# Patient Record
Sex: Female | Born: 1960 | Race: White | Hispanic: No | Marital: Married | Smoking: Former smoker
Health system: Southern US, Community
[De-identification: ages and names within clinical notes are randomized; demographics above are authoritative.]

## PROBLEM LIST (undated history)

## (undated) DIAGNOSIS — E785 Hyperlipidemia, unspecified: Secondary | ICD-10-CM

## (undated) DIAGNOSIS — I10 Essential (primary) hypertension: Secondary | ICD-10-CM

## (undated) DIAGNOSIS — J45909 Unspecified asthma, uncomplicated: Secondary | ICD-10-CM

## (undated) DIAGNOSIS — K5900 Constipation, unspecified: Secondary | ICD-10-CM

## (undated) DIAGNOSIS — T7840XA Allergy, unspecified, initial encounter: Secondary | ICD-10-CM

## (undated) HISTORY — DX: Unspecified asthma, uncomplicated: J45.909

## (undated) HISTORY — PX: ENDOMETRIAL ABLATION: SHX621

## (undated) HISTORY — DX: Hyperlipidemia, unspecified: E78.5

## (undated) HISTORY — DX: Constipation, unspecified: K59.00

## (undated) HISTORY — DX: Essential (primary) hypertension: I10

## (undated) HISTORY — PX: RETINAL TEAR REPAIR CRYOTHERAPY: SHX5304

## (undated) HISTORY — DX: Allergy, unspecified, initial encounter: T78.40XA

---

## 2014-07-11 ENCOUNTER — Encounter: Payer: Self-pay | Admitting: Gastroenterology

## 2014-08-07 ENCOUNTER — Ambulatory Visit (AMBULATORY_SURGERY_CENTER): Payer: Self-pay | Admitting: *Deleted

## 2014-08-07 VITALS — Ht 64.0 in | Wt 167.0 lb

## 2014-08-07 DIAGNOSIS — Z1211 Encounter for screening for malignant neoplasm of colon: Secondary | ICD-10-CM

## 2014-08-07 MED ORDER — MOVIPREP 100 G PO SOLR: 1.0000 | Freq: Once | ORAL | Status: DC

## 2014-08-07 NOTE — Progress Notes (Signed)
No egg or soy allergy No issues with past sedation. With the endometrial ablation had some itching post op, not severe and ? What sedation used  No home 02 No diet pills No emmi

## 2014-08-21 ENCOUNTER — Telehealth: Payer: Self-pay | Admitting: Gastroenterology

## 2014-08-21 NOTE — Telephone Encounter (Signed)
7471 am [facility] at number pt left at 0944 am. Lelan Pons PV

## 2014-08-21 NOTE — Telephone Encounter (Signed)
  Pt calling states she has occasional issue with her blood sugar dropping if she doesn't eat and wanted to know what she could drink to keep her sugar raised.  Instructed pt she can use gatorade( contains a lot of sugar), regular juices and sodas, popsicles and jello. Instructed to drink a lot of these things to maintain her sugar.  Told her the more she drinks the better and that she can try the clear peach ensure for protein of she chooses to do so. Pt verbalized understanding of instructions given Marijean Niemann

## 2014-08-21 NOTE — Telephone Encounter (Signed)
1021 am. Pt had RC when I had pt in office for PV. Asked receptionist to tell her i would return her call after finished, called with no answer again, [facility]. Lelan Pons PV

## 2014-08-23 ENCOUNTER — Encounter: Payer: Self-pay | Admitting: Gastroenterology

## 2014-08-23 ENCOUNTER — Ambulatory Visit (AMBULATORY_SURGERY_CENTER): Payer: BLUE CROSS/BLUE SHIELD | Admitting: Gastroenterology

## 2014-08-23 VITALS — BP 114/76 | HR 58 | Temp 97.2°F | Resp 13 | Ht 64.0 in | Wt 167.0 lb

## 2014-08-23 DIAGNOSIS — D124 Benign neoplasm of descending colon: Secondary | ICD-10-CM

## 2014-08-23 DIAGNOSIS — D123 Benign neoplasm of transverse colon: Secondary | ICD-10-CM

## 2014-08-23 DIAGNOSIS — Z1211 Encounter for screening for malignant neoplasm of colon: Secondary | ICD-10-CM

## 2014-08-23 MED ORDER — SODIUM CHLORIDE 0.9 % IV SOLN: 500.0000 mL | INTRAVENOUS | Status: DC

## 2014-08-23 NOTE — Progress Notes (Signed)
To recovery, report to Willis, RN, VSS 

## 2014-08-23 NOTE — Progress Notes (Signed)
Called to room to assist during endoscopic procedure.  Patient ID and intended procedure confirmed with present staff. Received instructions for my participation in the procedure from the performing physician.  

## 2014-08-23 NOTE — Progress Notes (Signed)
No problems noted in the recovery room. maw 

## 2014-08-23 NOTE — Patient Instructions (Signed)
YOU HAD AN ENDOSCOPIC PROCEDURE TODAY AT THE [facility]:   Refer to the procedure report that was given to you for any specific questions about what was found during the examination.  If the procedure report does not answer your questions, please call your gastroenterologist to clarify.  If you requested that your care partner not be given the details of your procedure findings, then the procedure report has been included in a sealed envelope for you to review at your convenience later.  YOU SHOULD EXPECT: Some feelings of bloating in the abdomen. Passage of more gas than usual.  Walking can help get rid of the air that was put into your GI tract during the procedure and reduce the bloating. If you had a lower endoscopy (such as a colonoscopy or flexible sigmoidoscopy) you may notice spotting of blood in your stool or on the toilet paper. If you underwent a bowel prep for your procedure, you may not have a normal bowel movement for a few days.  Please Note:  You might notice some irritation and congestion in your nose or some drainage.  This is from the oxygen used during your procedure.  There is no need for concern and it should clear up in a day or so.  SYMPTOMS TO REPORT IMMEDIATELY:   Following lower endoscopy (colonoscopy or flexible sigmoidoscopy):  Excessive amounts of blood in the stool  Significant tenderness or worsening of abdominal pains  Swelling of the abdomen that is new, acute  Fever of 100F or higher   For urgent or emergent issues, a gastroenterologist can be reached at any hour by calling [phone].   DIET: Your first meal following the procedure should be a small meal and then it is ok to progress to your normal diet. Heavy or fried foods are harder to digest and may make you feel nauseous or bloated.  Likewise, meals heavy in dairy and vegetables can increase bloating.  Drink plenty of fluids but you should avoid alcoholic beverages for 24  hours.  ACTIVITY:  You should plan to take it easy for the rest of today and you should NOT DRIVE or use heavy machinery until tomorrow (because of the sedation medicines used during the test).    FOLLOW UP: Our staff will call the number listed on your records the next business day following your procedure to check on you and address any questions or concerns that you may have regarding the information given to you following your procedure. If we do not reach you, we will leave a message.  However, if you are feeling well and you are not experiencing any problems, there is no need to return our call.  We will assume that you have returned to your regular daily activities without incident.  If any biopsies were taken you will be contacted by phone or by letter within the next 1-3 weeks.  Please call us at [phone] if you have not heard about the biopsies in 3 weeks.    SIGNATURES/CONFIDENTIALITY: You and/or your care partner have signed paperwork which will be entered into your electronic medical record.  These signatures attest to the fact that that the information above on your After Visit Summary has been reviewed and is understood.  Full responsibility of the confidentiality of this discharge information lies with you and/or your care-partner.     Handout was given to your care partner on polyps. You may resume your current medications today. Await biopsy results. Please call   if any questions or concerns.   

## 2014-08-23 NOTE — Op Note (Addendum)
[facility]  [address] [place], [address]   COLONOSCOPY PROCEDURE REPORT  PATIENT: Latasha Morris, Latasha Morris  MR#: [id number] BIRTHDATE: [birth year] , 54  yrs. old GENDER: female ENDOSCOPIST: Milus Banister, MD REFERRED OP:[id number] Asher Muir, M.D. PROCEDURE DATE:  08/23/2014 PROCEDURE:   Colonoscopy, screening and Colonoscopy with snare polypectomy First Screening Colonoscopy - Avg.  risk and is 50 yrs.  old or older Yes.  Prior Negative Screening - Now for repeat screening. N/A  History of Adenoma - Now for follow-up colonoscopy & has been > or = to 3 yrs.  N/A  Polyps removed today? Yes ASA CLASS:   Class II INDICATIONS:Screening for colonic neoplasia and Colorectal Neoplasm Risk Assessment for this procedure is average risk. MEDICATIONS: Monitored anesthesia care and Propofol 200 mg IV  DESCRIPTION OF PROCEDURE:   After the risks benefits and alternatives of the procedure were thoroughly explained, informed consent was obtained.  The digital rectal exam revealed no abnormalities of the rectum.   The LB [id number]  endoscope was introduced through the anus and advanced to the cecum, which was identified by both the appendix and ileocecal valve. No adverse events experienced.   The quality of the prep was excellent.  The instrument was then slowly withdrawn as the colon was fully examined. Estimated blood loss is zero unless otherwise noted in this procedure report.   COLON FINDINGS: Two sessile polyps ranging between 3-29mm in size were found in the descending colon and transverse colon. Polypectomies were performed with a cold snare.  The resection was complete, the polyp tissue was completely retrieved and sent to histology.   The examination was otherwise normal.  Retroflexed views revealed no abnormalities. The time to cecum = 1.9 Withdrawal time = 11.0   The scope was withdrawn and the procedure completed. COMPLICATIONS: There were no immediate  complications.  ENDOSCOPIC IMPRESSION: 1.  Two sessile polyps ranging between 3-40mm in size were found in the descending colon and transverse colon; polypectomies were performed with a cold snare 2.   The examination was otherwise normal  RECOMMENDATIONS: If the polyp(s) removed today are proven to be adenomatous (pre-cancerous) polyps, you will need a repeat colonoscopy in 5 years.  Otherwise you should continue to follow colorectal cancer screening guidelines for "routine risk" patients with colonoscopy in 10 years.  You will receive a letter within 1-2 weeks with the results of your biopsy as well as final recommendations.  Please call my office if you have not received a letter after 3 weeks.  eSigned:  Milus Banister, MD 08/23/2014 8:50 AM

## 2014-08-26 ENCOUNTER — Telehealth: Payer: Self-pay | Admitting: *Deleted

## 2014-08-26 NOTE — Telephone Encounter (Signed)
  Follow up Call-  Call back number 08/23/2014  Post procedure Call Back phone  # [phone]  Permission to leave phone message Yes     Patient questions:  Do you have a fever, pain , or abdominal swelling? No. Pain Score  0 *  Have you tolerated food without any problems? Yes.    Have you been able to return to your normal activities? Yes.    Do you have any questions about your discharge instructions: Diet   No. Medications  No. Follow up visit  No.  Do you have questions or concerns about your Care? No.  Actions: * If pain score is 4 or above: No action needed, pain <4.

## 2014-08-27 ENCOUNTER — Encounter: Payer: Self-pay | Admitting: Gastroenterology

## 2018-06-12 ENCOUNTER — Other Ambulatory Visit: Payer: Self-pay | Admitting: Surgical Oncology

## 2018-06-12 DIAGNOSIS — R9389 Abnormal findings on diagnostic imaging of other specified body structures: Secondary | ICD-10-CM

## 2018-06-12 DIAGNOSIS — N6452 Nipple discharge: Secondary | ICD-10-CM

## 2018-06-20 ENCOUNTER — Other Ambulatory Visit: Payer: Self-pay | Admitting: Surgical Oncology

## 2018-06-20 DIAGNOSIS — R9389 Abnormal findings on diagnostic imaging of other specified body structures: Secondary | ICD-10-CM

## 2018-06-21 ENCOUNTER — Ambulatory Visit
Admission: RE | Admit: 2018-06-21 | Discharge: 2018-06-21 | Disposition: A | Discharge status: Home / Self Care | Payer: BLUE CROSS/BLUE SHIELD | Source: Ambulatory Visit | Attending: Surgical Oncology | Admitting: Surgical Oncology

## 2018-06-21 ENCOUNTER — Other Ambulatory Visit: Payer: Self-pay

## 2018-06-21 DIAGNOSIS — R9389 Abnormal findings on diagnostic imaging of other specified body structures: Secondary | ICD-10-CM

## 2018-06-21 DIAGNOSIS — N6452 Nipple discharge: Secondary | ICD-10-CM

## 2018-06-21 MED ORDER — GADOBUTROL 1 MMOL/ML IV SOLN
8.0000 mL | Freq: Once | INTRAVENOUS | Status: AC | PRN
  Administered 2018-06-21: 8 mL via INTRAVENOUS

## 2019-03-23 DIAGNOSIS — M549 Dorsalgia, unspecified: Secondary | ICD-10-CM

## 2019-03-23 HISTORY — DX: Dorsalgia, unspecified: M54.9

## 2019-10-25 ENCOUNTER — Encounter: Payer: Self-pay | Admitting: Gastroenterology

## 2020-01-04 ENCOUNTER — Other Ambulatory Visit: Payer: Self-pay

## 2020-01-04 ENCOUNTER — Ambulatory Visit (AMBULATORY_SURGERY_CENTER): Payer: Self-pay | Admitting: *Deleted

## 2020-01-04 VITALS — Ht 64.0 in | Wt 178.0 lb

## 2020-01-04 DIAGNOSIS — Z86010 Personal history of colonic polyps: Secondary | ICD-10-CM

## 2020-01-04 NOTE — Progress Notes (Signed)

## 2020-01-07 ENCOUNTER — Encounter: Payer: Self-pay | Admitting: Gastroenterology

## 2020-01-18 ENCOUNTER — Encounter: Payer: Self-pay | Admitting: Gastroenterology

## 2020-01-18 ENCOUNTER — Ambulatory Visit (AMBULATORY_SURGERY_CENTER): Payer: BC Managed Care – PPO | Admitting: Gastroenterology

## 2020-01-18 ENCOUNTER — Other Ambulatory Visit: Payer: Self-pay

## 2020-01-18 VITALS — BP 137/83 | HR 63 | Temp 98.0°F | Resp 12 | Ht 64.0 in | Wt 178.0 lb

## 2020-01-18 DIAGNOSIS — Z86010 Personal history of colonic polyps: Secondary | ICD-10-CM

## 2020-01-18 HISTORY — PX: COLONOSCOPY: SHX174

## 2020-01-18 MED ORDER — SODIUM CHLORIDE 0.9 % IV SOLN: 500.0000 mL | Freq: Once | INTRAVENOUS | Status: AC

## 2020-01-18 NOTE — Progress Notes (Addendum)
Vital signs checked by:CW  The patient states no changes in medical or surgical history since pre-visit screening on 01/04/20.  Patient has elevated BP, states she took her BP mededication this morning but vomited shortly after.  CRNA and MD made aware.

## 2020-01-18 NOTE — Patient Instructions (Signed)
Thank you for letting us take care of your healthcare needs today.   YOU HAD AN ENDOSCOPIC PROCEDURE TODAY AT THE  ENDOSCOPY CENTER:   Refer to the procedure report that was given to you for any specific questions about what was found during the examination.  If the procedure report does not answer your questions, please call your gastroenterologist to clarify.  If you requested that your care partner not be given the details of your procedure findings, then the procedure report has been included in a sealed envelope for you to review at your convenience later.  YOU SHOULD EXPECT: Some feelings of bloating in the abdomen. Passage of more gas than usual.  Walking can help get rid of the air that was put into your GI tract during the procedure and reduce the bloating. If you had a lower endoscopy (such as a colonoscopy or flexible sigmoidoscopy) you may notice spotting of blood in your stool or on the toilet paper. If you underwent a bowel prep for your procedure, you may not have a normal bowel movement for a few days.  Please Note:  You might notice some irritation and congestion in your nose or some drainage.  This is from the oxygen used during your procedure.  There is no need for concern and it should clear up in a day or so.  SYMPTOMS TO REPORT IMMEDIATELY:  Following lower endoscopy (colonoscopy or flexible sigmoidoscopy):  Excessive amounts of blood in the stool  Significant tenderness or worsening of abdominal pains  Swelling of the abdomen that is new, acute  Fever of 100F or higher  For urgent or emergent issues, a gastroenterologist can be reached at any hour by calling [phone]. Do not use MyChart messaging for urgent concerns.    DIET:  We do recommend a small meal at first, but then you may proceed to your regular diet.  Drink plenty of fluids but you should avoid alcoholic beverages for 24 hours.  ACTIVITY:  You should plan to take it easy for the rest of today and  you should NOT DRIVE or use heavy machinery until tomorrow (because of the sedation medicines used during the test).    FOLLOW UP: Our staff will call the number listed on your records 48-72 hours following your procedure to check on you and address any questions or concerns that you may have regarding the information given to you following your procedure. If we do not reach you, we will leave a message.  We will attempt to reach you two times.  During this call, we will ask if you have developed any symptoms of COVID 19. If you develop any symptoms (ie: fever, flu-like symptoms, shortness of breath, cough etc.) before then, please call [phone].  If you test positive for Covid 19 in the 2 weeks post procedure, please call and report this information to us.    If any biopsies were taken you will be contacted by phone or by letter within the next 1-3 weeks.  Please call us at [phone] if you have not heard about the biopsies in 3 weeks.    SIGNATURES/CONFIDENTIALITY: You and/or your care partner have signed paperwork which will be entered into your electronic medical record.  These signatures attest to the fact that that the information above on your After Visit Summary has been reviewed and is understood.  Full responsibility of the confidentiality of this discharge information lies with you and/or your care-partner.  

## 2020-01-18 NOTE — Progress Notes (Signed)
Report to PACU, RN, vss, BBS= Clear.  

## 2020-01-18 NOTE — Op Note (Signed)
[facility] Patient Name: Latasha Morris Procedure Date: 01/18/2020 8:54 AM MRN: [id number] Endoscopist: Milus Banister , MD Age: 59 Referring MD:  Date of Birth: [birth year] Gender: Female Account #: [id number] Procedure:                Colonoscopy Indications:              High risk colon cancer surveillance: Personal                            history of colonic polyps; colonoscopy 2016 two                            subCM SSPs Medicines:                Monitored Anesthesia Care Procedure:                Pre-Anesthesia Assessment:                           - Prior to the procedure, a History and Physical                            was performed, and patient medications and                            allergies were reviewed. The patient's tolerance of                            previous anesthesia was also reviewed. The risks                            and benefits of the procedure and the sedation                            options and risks were discussed with the patient.                            All questions were answered, and informed consent                            was obtained. Prior Anticoagulants: The patient has                            taken no previous anticoagulant or antiplatelet                            agents. ASA Grade Assessment: II - A patient with                            mild systemic disease. After reviewing the risks                            and benefits, the patient was deemed in  satisfactory condition to undergo the procedure.                           After obtaining informed consent, the colonoscope                            was passed under direct vision. Throughout the                            procedure, the patient's blood pressure, pulse, and                            oxygen saturations were monitored continuously. The                            Colonoscope was introduced through the anus and                             advanced to the the cecum, identified by                            appendiceal orifice and ileocecal valve. The                            colonoscopy was performed without difficulty. The                            patient tolerated the procedure well. The quality                            of the bowel preparation was good. The ileocecal                            valve, appendiceal orifice, and rectum were                            photographed. Scope In: 8:58:21 AM Scope Out: 9:12:50 AM Scope Withdrawal Time: 0 hours 11 minutes 53 seconds  Total Procedure Duration: 0 hours 14 minutes 29 seconds  Findings:                 The entire examined colon appeared normal on direct                            and retroflexion views. Complications:            No immediate complications. Estimated blood loss:                            None. Estimated Blood Loss:     Estimated blood loss: none. Impression:               - The entire examined colon is normal on direct and                            retroflexion views.                           -  No polyps or cancers. Recommendation:           - Patient has a contact number available for                            emergencies. The signs and symptoms of potential                            delayed complications were discussed with the                            patient. Return to normal activities tomorrow.                            Written discharge instructions were provided to the                            patient.                           - Resume previous diet.                           - Continue present medications.                           - Repeat colonoscopy in 7 years for surveillance. Milus Banister, MD 01/18/2020 9:16:07 AM This report has been signed electronically.

## 2020-01-22 ENCOUNTER — Telehealth: Payer: Self-pay | Admitting: *Deleted

## 2020-01-22 NOTE — Telephone Encounter (Signed)
Left message on f/u call 

## 2020-05-03 IMAGING — MG MM CLIP PLACEMENT
4 series · 4 of 12 positions shown · non-contrast
Comparison: Previous exam(s).

CLINICAL DATA: 57-year-old female status post 3 area MRI guided
biopsy, including 2 areas on the left and 1 area on the right.

EXAM:
DIAGNOSTIC BILATERAL MAMMOGRAM POST MRI BIOPSY

[L CC synth-2D]
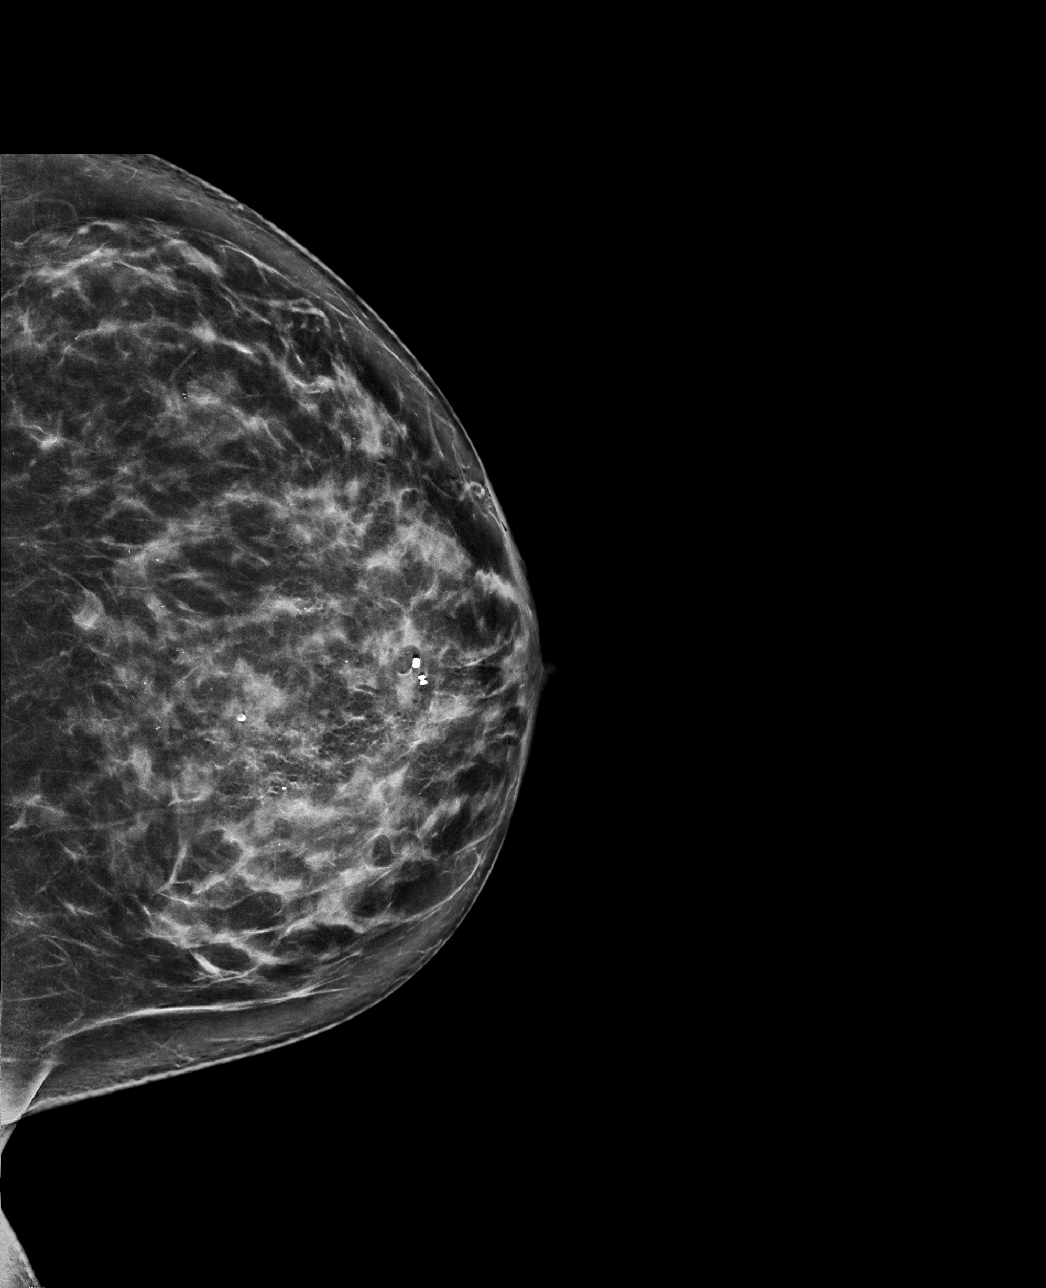

[L ML synth-2D]
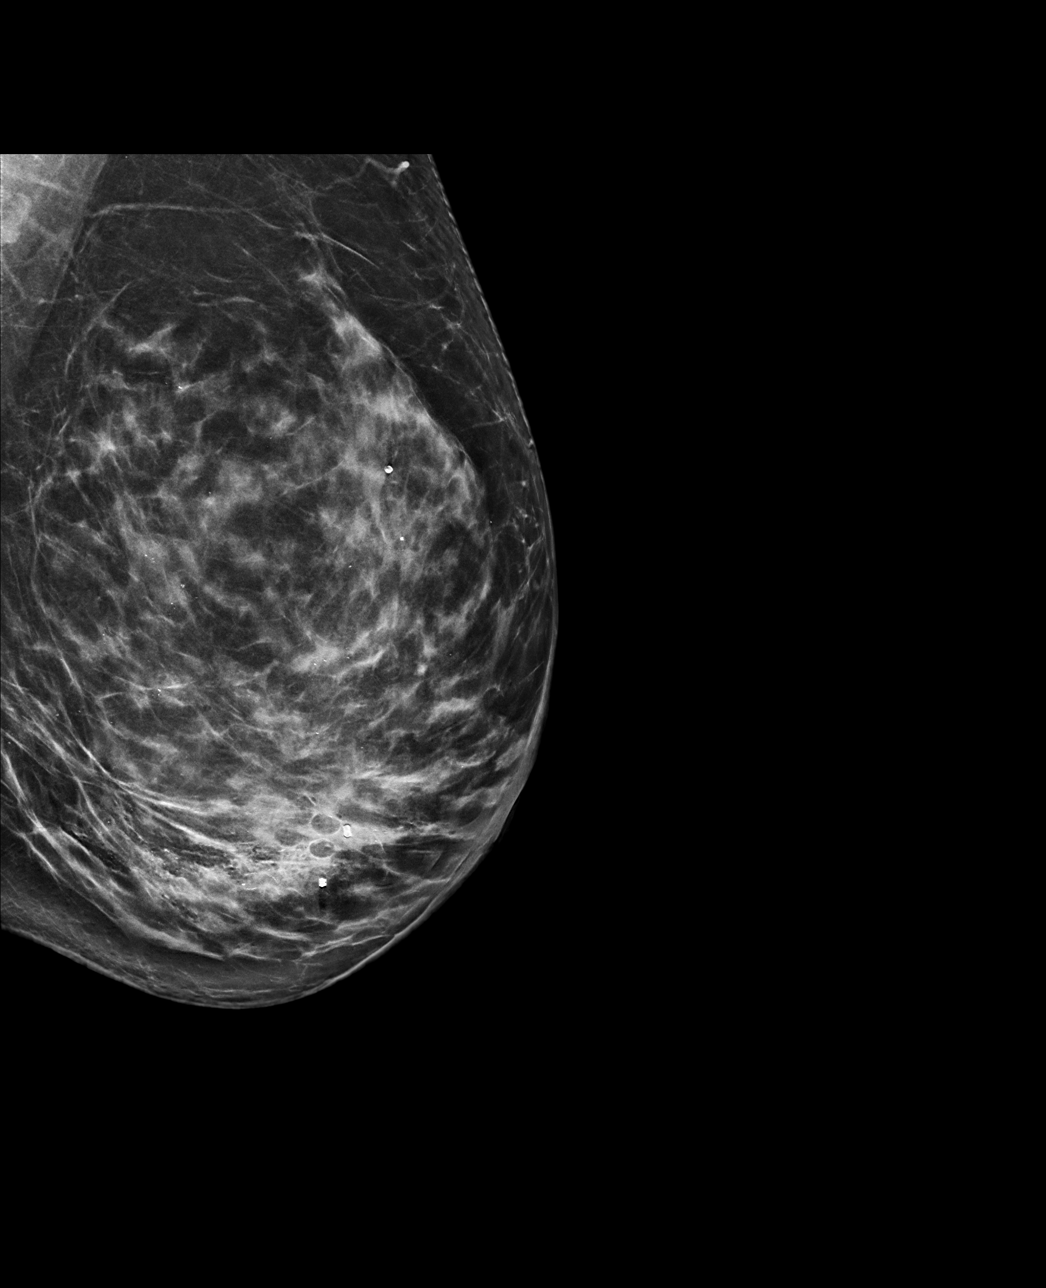

[L CC tomo · tomo slice 41/82.0]
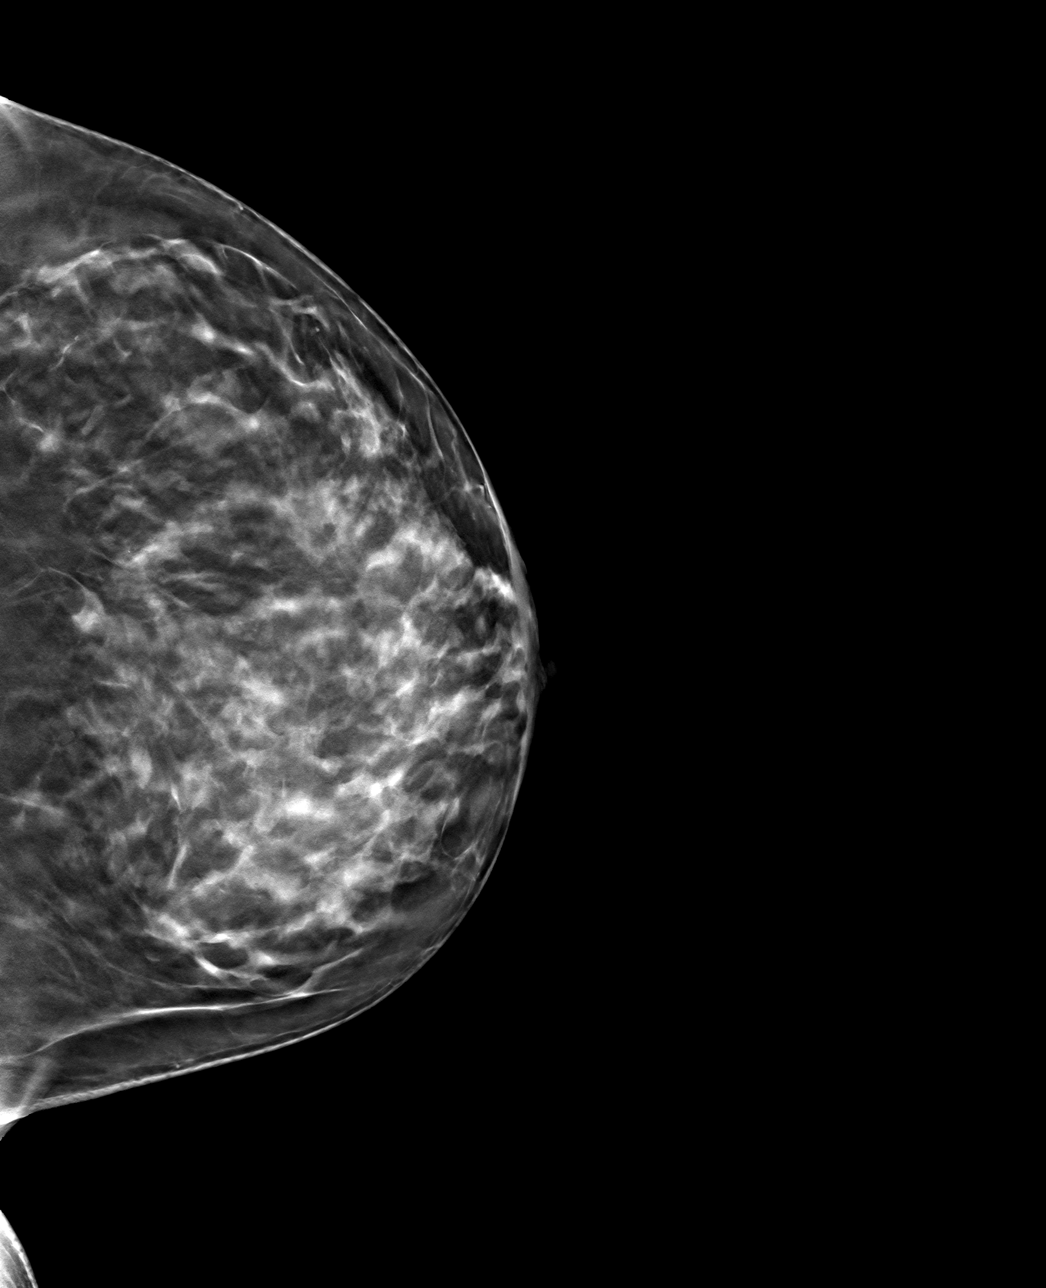

[L ML tomo · tomo slice 43/84.0]
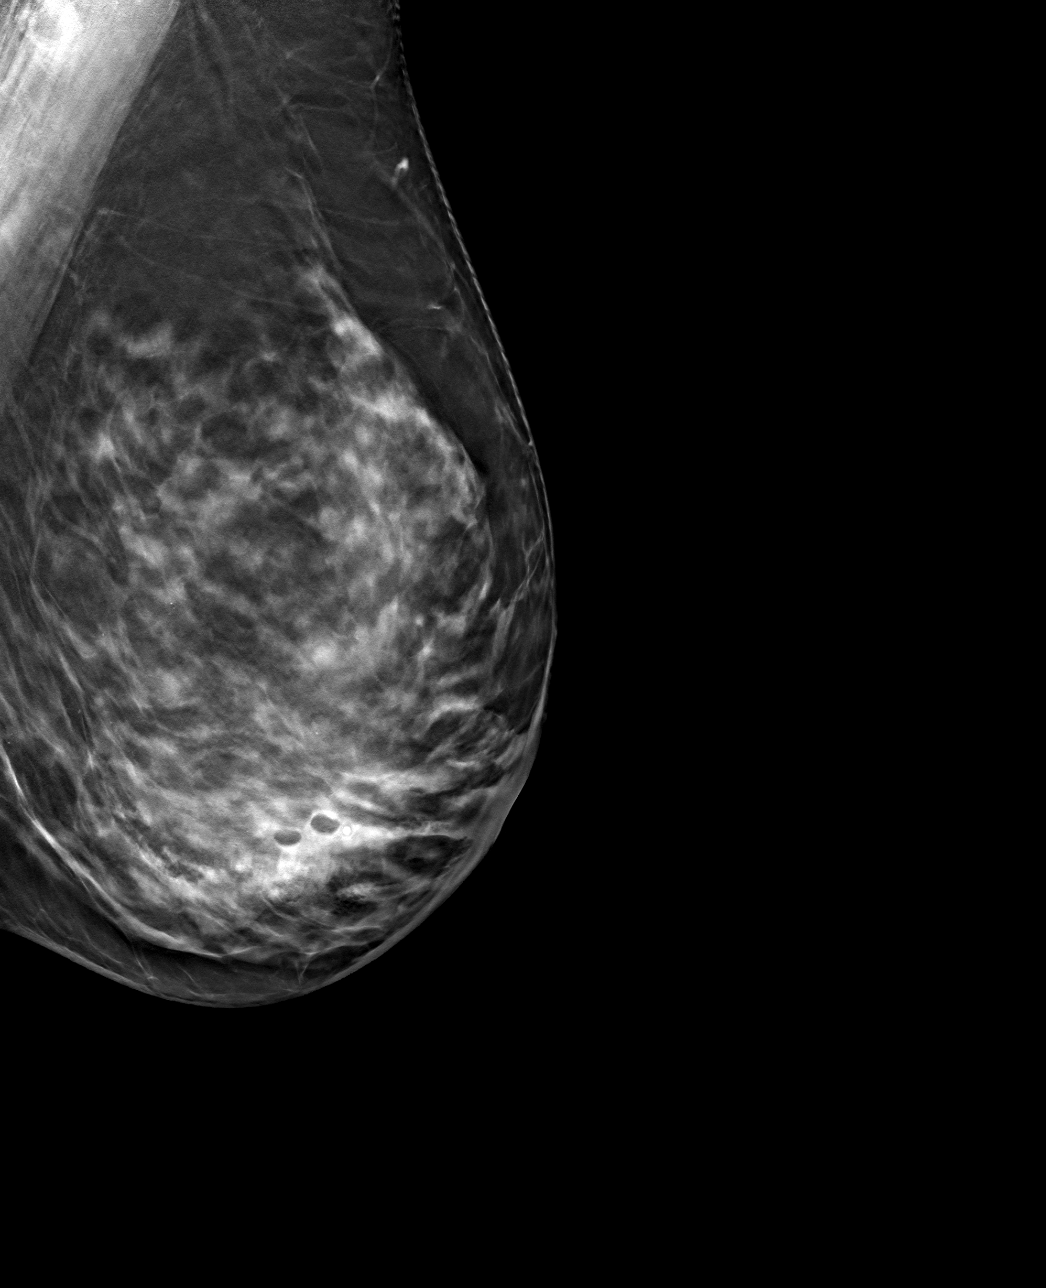

[4 of 12 positions shown; findings below may reference images not displayed]

FINDINGS: Mammographic images were obtained following MRI guided biopsy of the
bilateral breasts. Two post biopsy clips, including a cylinder in
barbell shaped clip identified in the subareolar left breast. A
barbell shaped clip is identified in the subareolar right breast.
IMPRESSION: Three post biopsy clips in the expected locations.

Final Assessment: Post Procedure Mammograms for Marker Placement

## 2020-05-03 IMAGING — MG MM CLIP PLACEMENT
4 series · 4 of 12 positions shown · non-contrast
Comparison: Previous exam(s).

CLINICAL DATA: 57-year-old female status post 3 area MRI guided
biopsy, including 2 areas on the left and 1 area on the right.

EXAM:
DIAGNOSTIC BILATERAL MAMMOGRAM POST MRI BIOPSY

[R ML synth-2D]
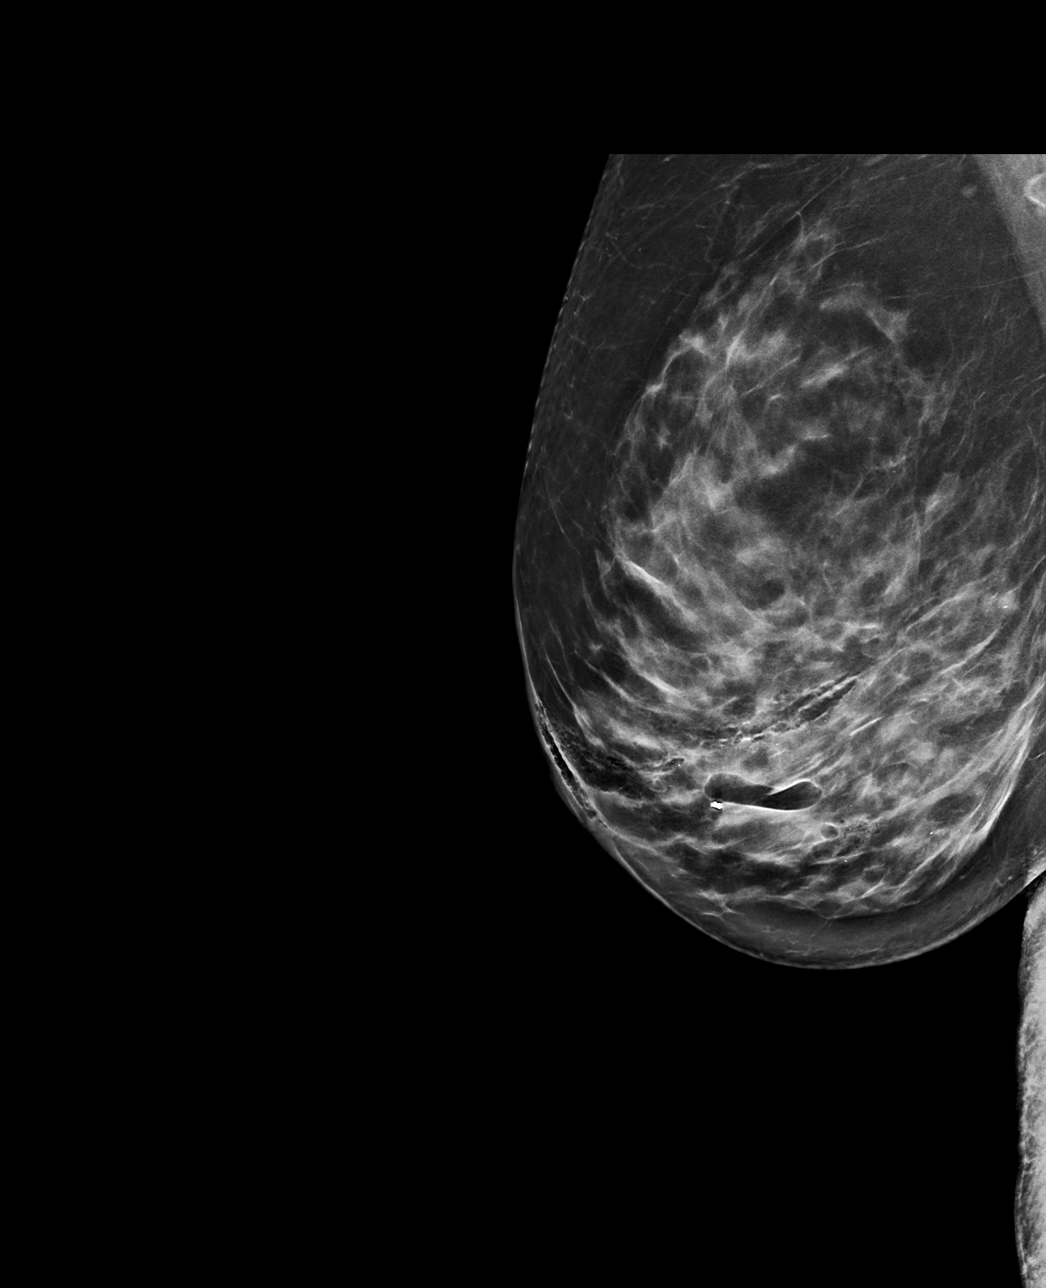

[R CC synth-2D]
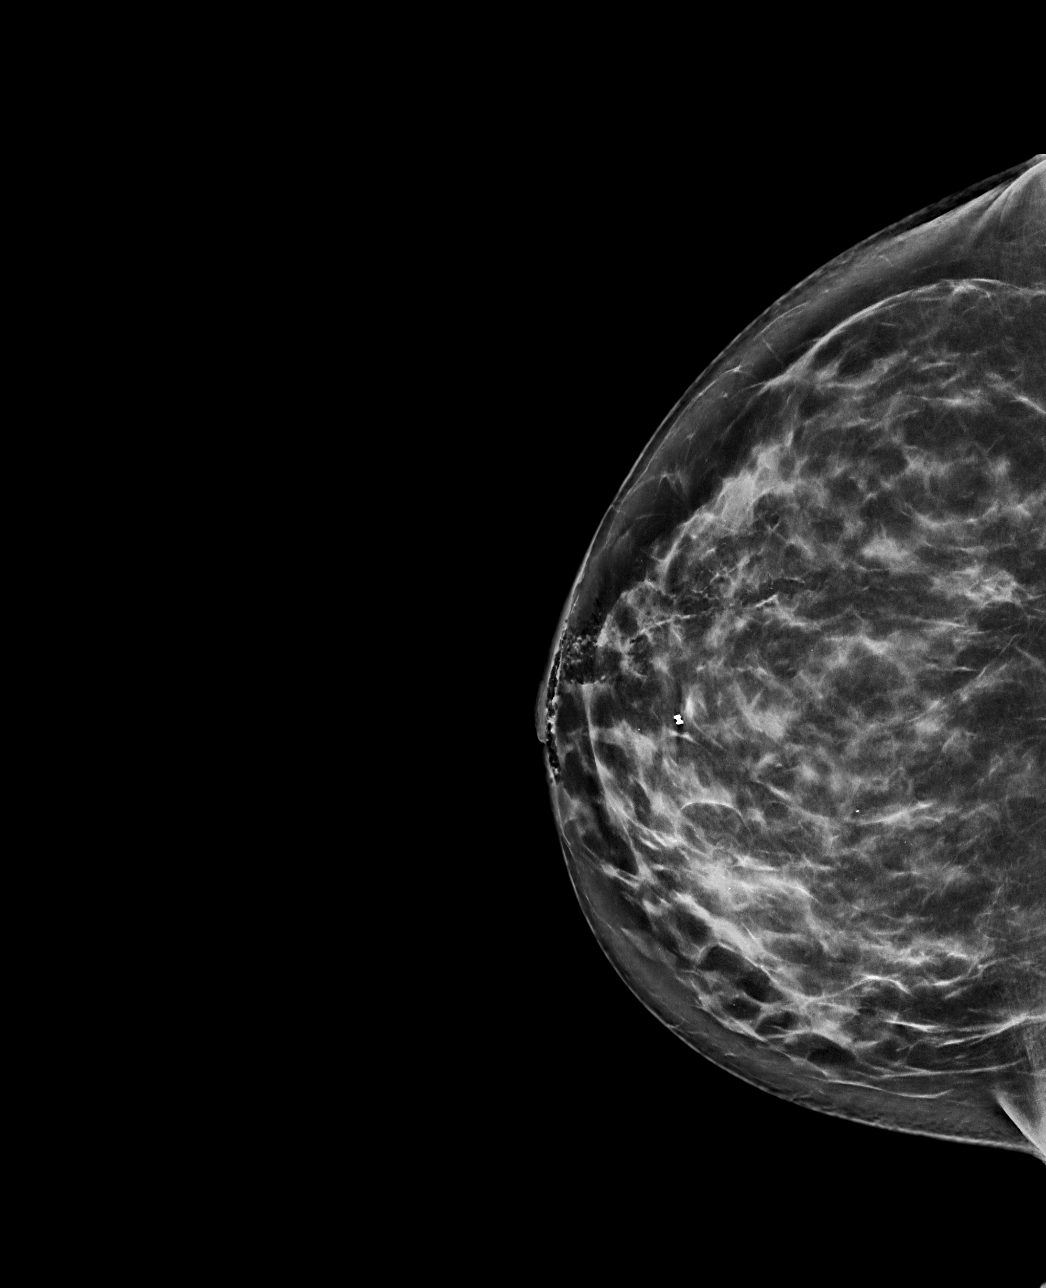

[R ML tomo · tomo slice 47/92.0]
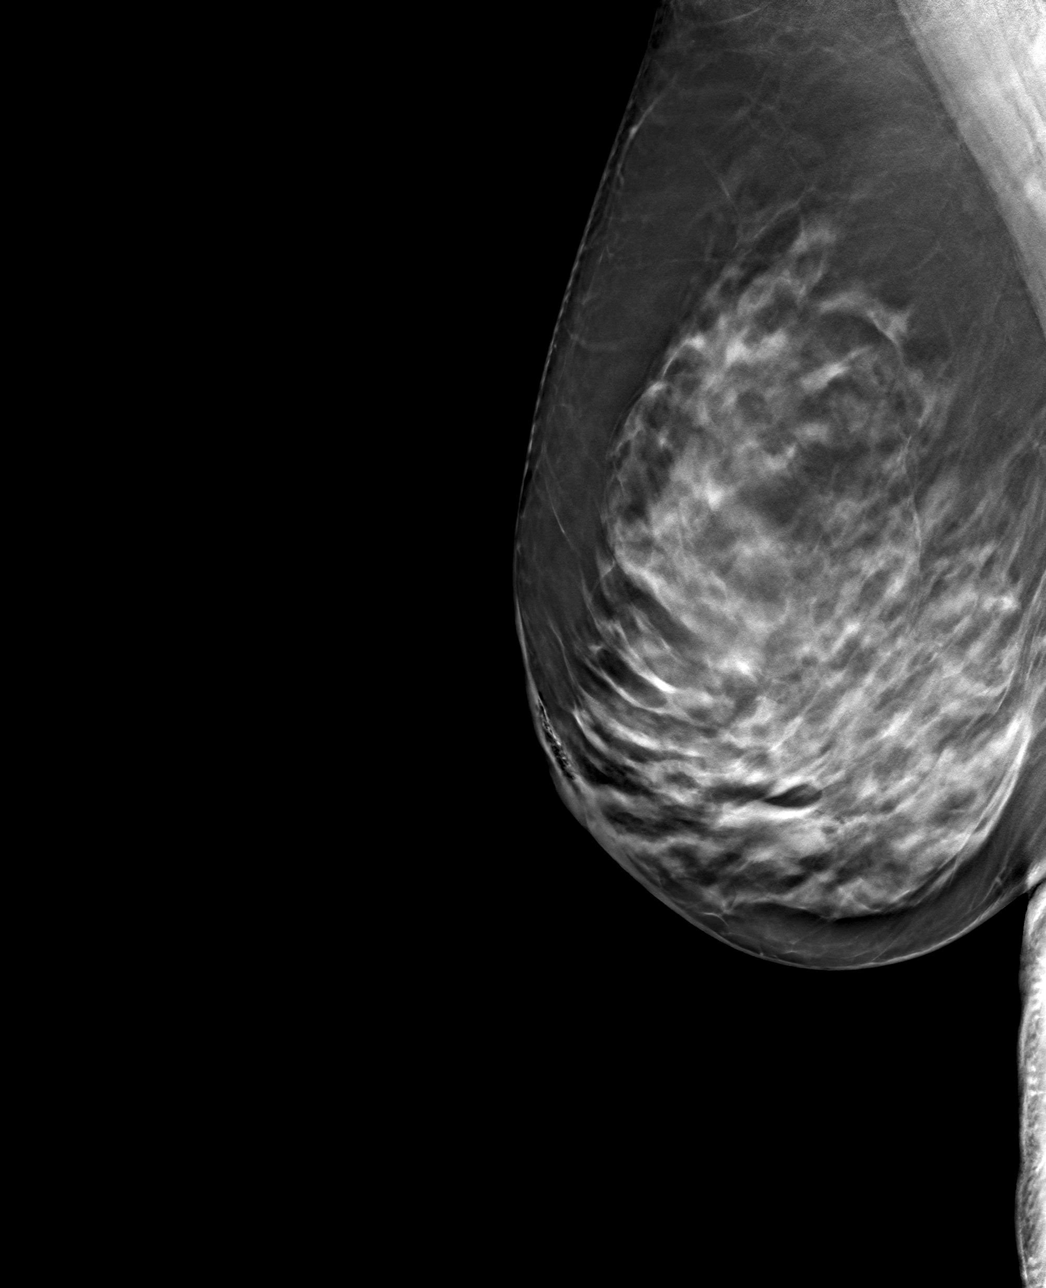

[R CC tomo · tomo slice 45/88.0]
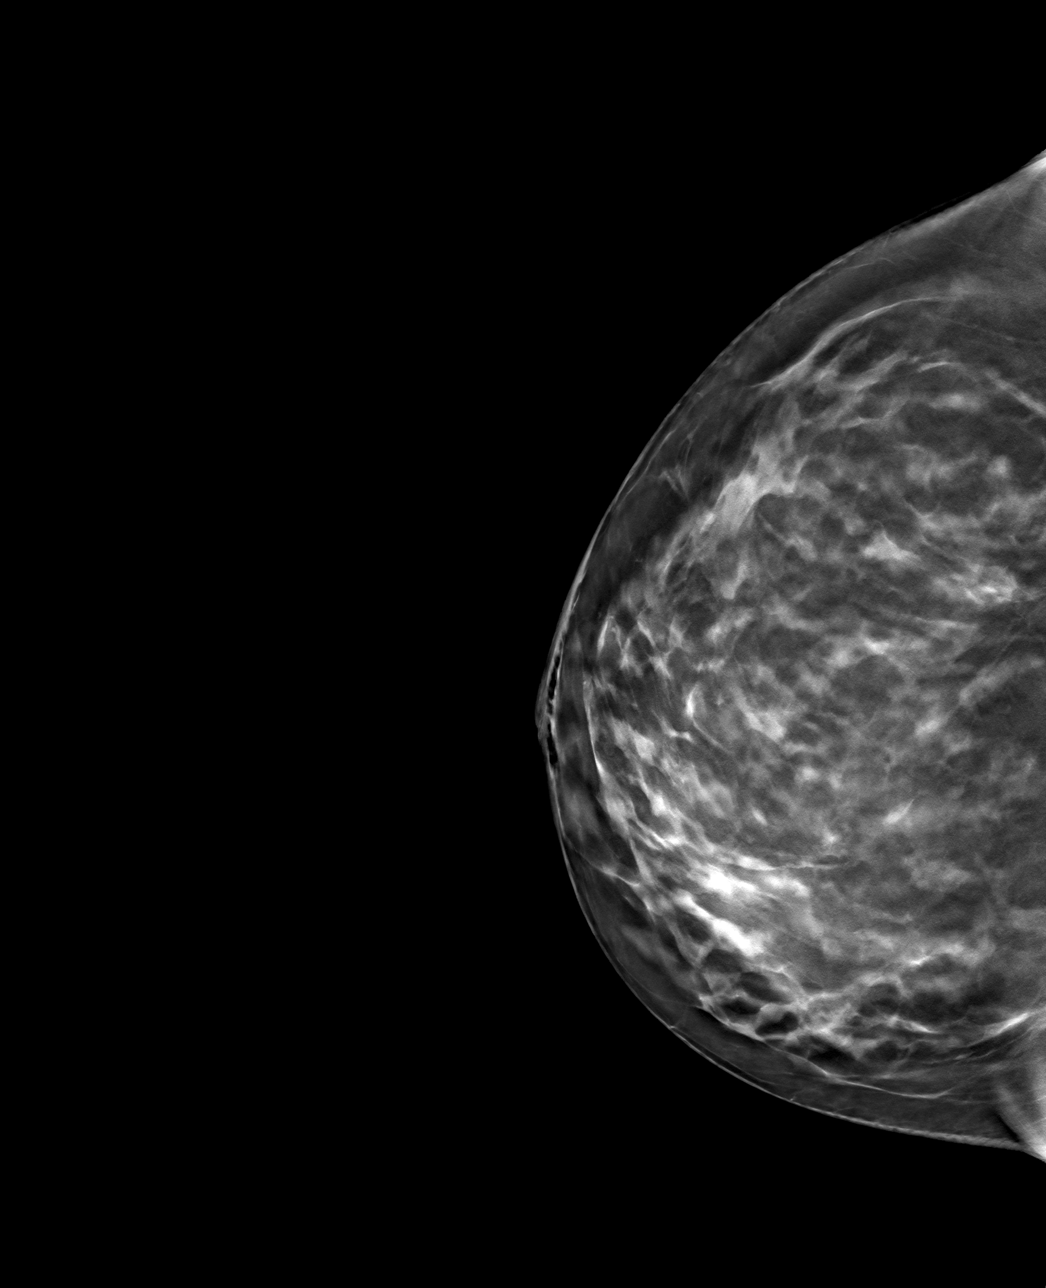

[4 of 12 positions shown; findings below may reference images not displayed]

FINDINGS: Mammographic images were obtained following MRI guided biopsy of the
bilateral breasts. Two post biopsy clips, including a cylinder in
barbell shaped clip identified in the subareolar left breast. A
barbell shaped clip is identified in the subareolar right breast.
IMPRESSION: Three post biopsy clips in the expected locations.

Final Assessment: Post Procedure Mammograms for Marker Placement

## 2020-05-03 IMAGING — MR MR BREAST BX W LOC DEV EA ADD LESION IMAGE BX SPEC MR GUIDE*L*
7 of 10 series · 31 of 48 positions shown · IV contrast (8 ml gadavist)
Comparison: Previous exams.
COMPARISON: Previous exams.

Addendum:
CLINICAL DATA: 57-year-old female with 3 areas of indeterminate
enhancement within the bilateral breasts. This includes 2 areas
within the left breast and 1 area within the right breast in the
setting of left nipple discharge.

EXAM:
MRI GUIDED CORE NEEDLE BIOPSY OF THE bilateral BREAST
TECHNIQUE: Multiplanar, multisequence MR imaging of the bilateral breast was
performed both before and after administration of intravenous
contrast.
CONTRAST:  8 mL Gadavist

[Series 2: fiducial bilateral · sagittal · 2.0mm · 1.33mm/px · 4 of 144 slices shown]
[im 1/144]
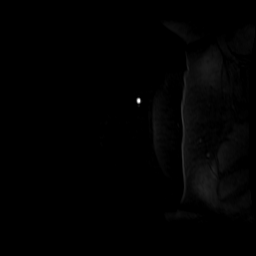
[im 48/144]
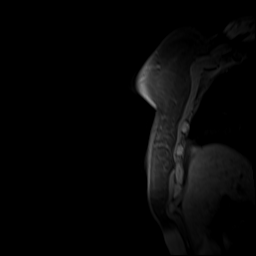
[im 96/144]
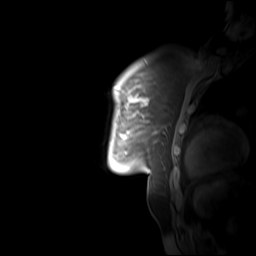
[im 144/144]
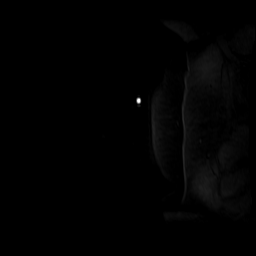

[Series 3: dynamic pre · axial · non-contrast · 1.3mm · 0.76mm/px · z∈[-28,+158]mm · 4 of 144 slices shown]
[im 1/144]
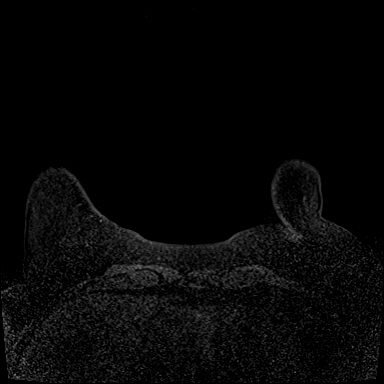
[im 48/144]
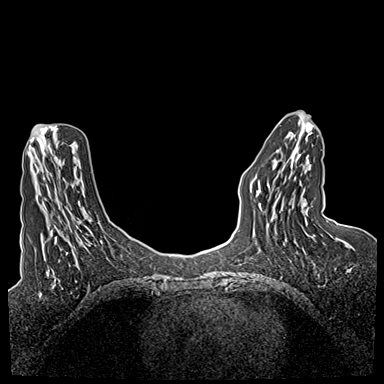
[im 96/144]
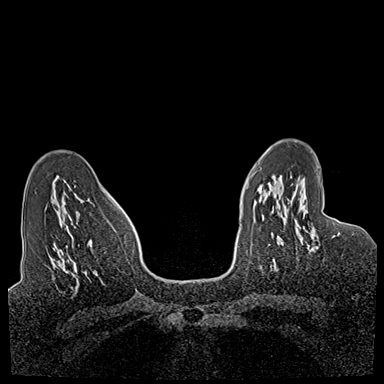
[im 144/144]
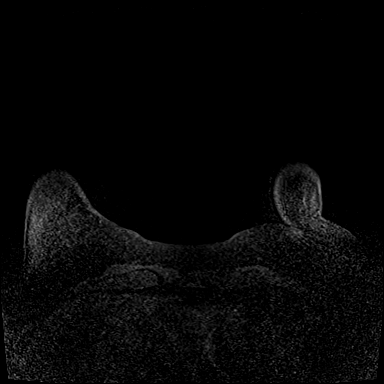

[Series 4: dynamic post 20 · axial · 1.3mm · 0.76mm/px · z∈[-28,+158]mm · 5 of 144 slices shown (1 of 2)]
[im 1/144]
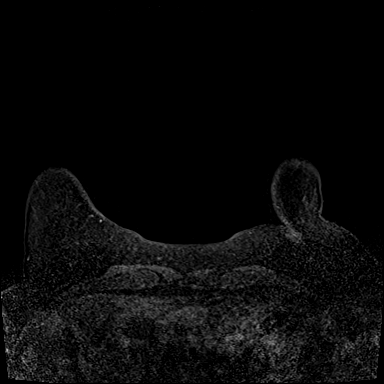
[im 36/144]
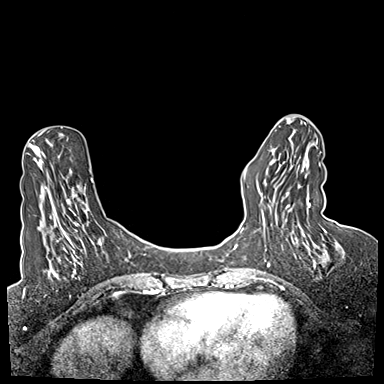
[im 72/144]
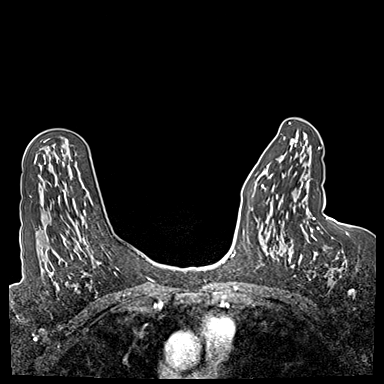
[im 108/144]
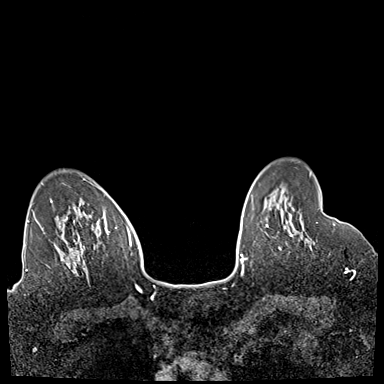
[im 144/144]
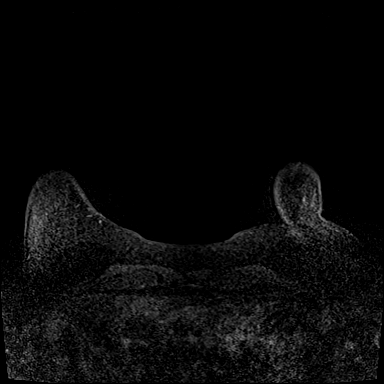

[Series 5: dynamic post 20 · axial · 1.3mm · 0.76mm/px · z∈[-28,+158]mm · 5 of 144 slices shown (2 of 2)]
[im 1/144]
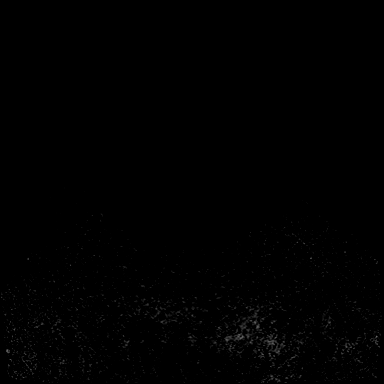
[im 36/144]
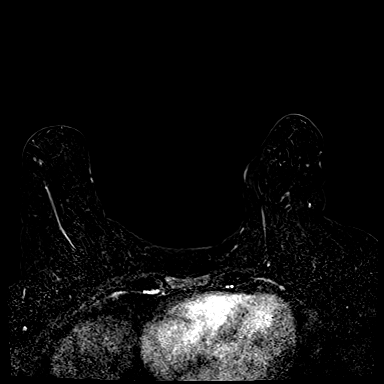
[im 72/144]
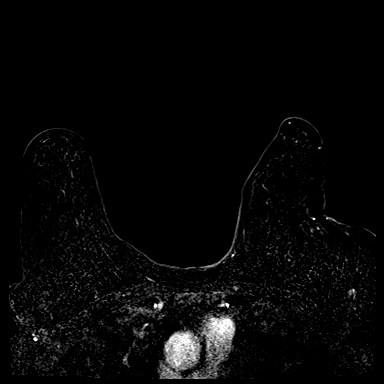
[im 108/144]
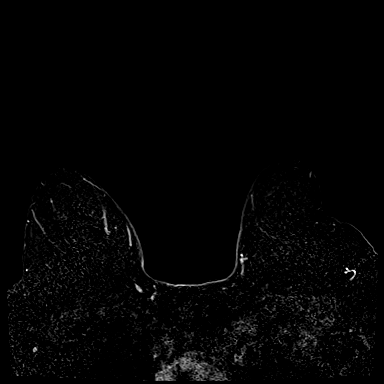
[im 144/144]
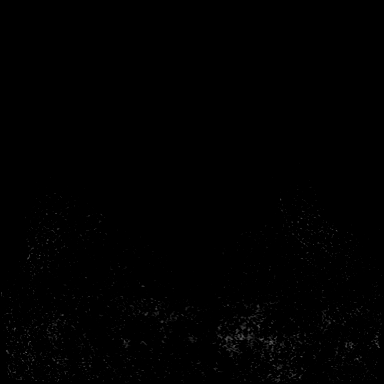

[Series 6: dynamic post 3 · axial · 1.3mm · 0.76mm/px · z∈[-28,+158]mm · 5 of 144 slices shown (1 of 2)]
[im 1/144]
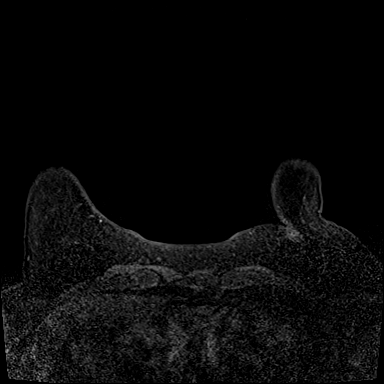
[im 36/144]
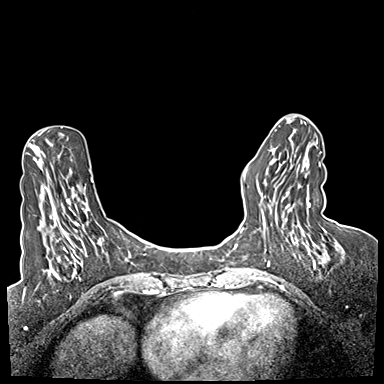
[im 72/144]
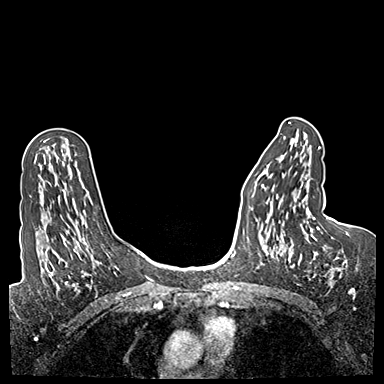
[im 108/144]
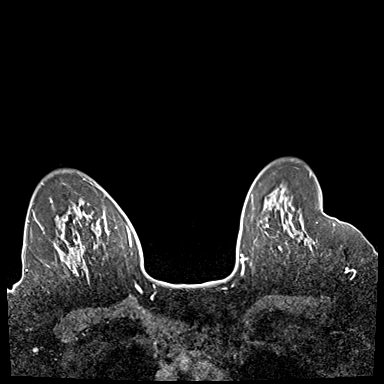
[im 144/144]
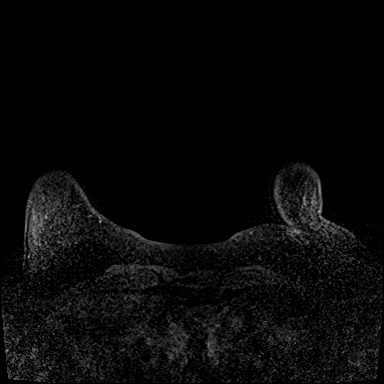

[Series 7: dynamic post 3 · axial · 1.3mm · 0.76mm/px · z∈[-28,+158]mm · 5 of 144 slices shown (2 of 2)]
[im 1/144]
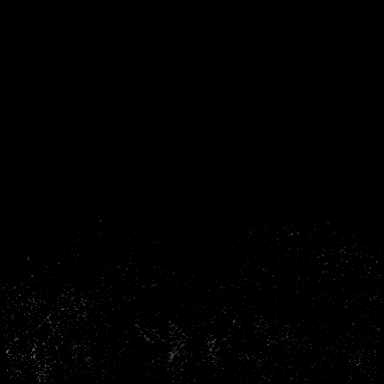
[im 36/144]
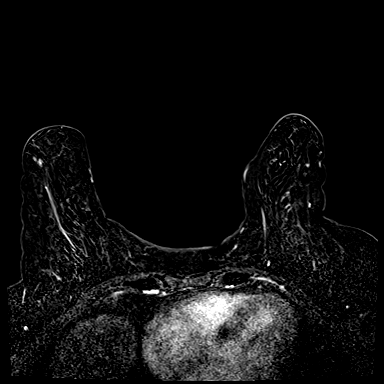
[im 72/144]
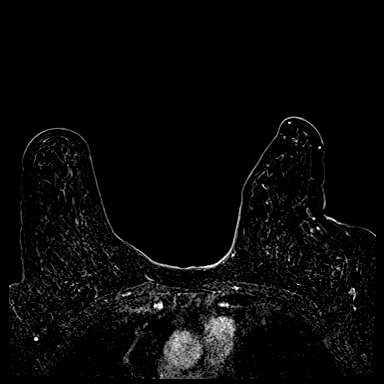
[im 108/144]
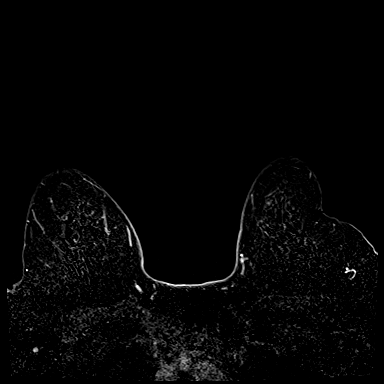
[im 144/144]
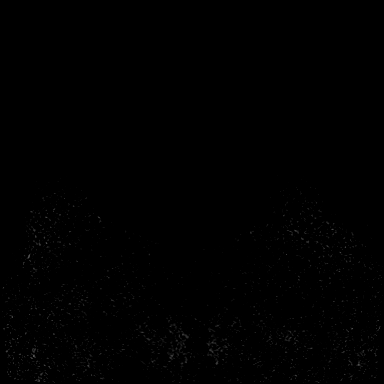

[Series 8: needle confirmation · axial · 1.3mm · 0.76mm/px · z∈[-28,+65]mm · 3 of 144 slices shown]
[im 1/144]
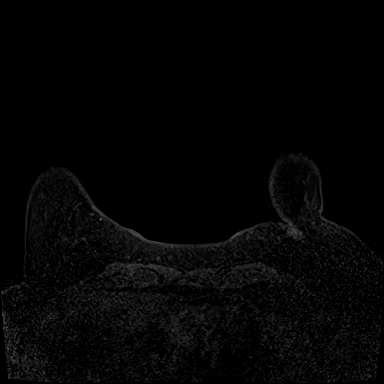
[im 36/144]
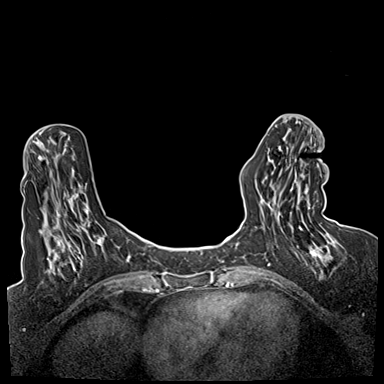
[im 72/144]
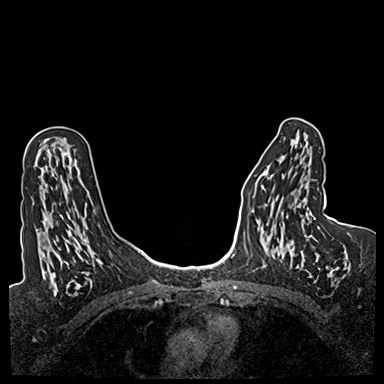

[31 of 48 positions shown; findings below may reference images not displayed]

FINDINGS: I met with the patient, and we discussed the procedure of MRI guided
biopsy, including risks, benefits, and alternatives. Specifically,
we discussed the risks of infection, bleeding, tissue injury, clip
migration, and inadequate sampling. Informed, written consent was
given. The usual time out protocol was performed immediately prior
to the procedure.

Using sterile technique, 1% Lidocaine, MRI guidance, and a 9 gauge
vacuum assisted device, biopsy was performed of of linear
enhancement in the subareolar left breast using a lateral approach.
At the conclusion of the procedure, a cylinder shaped tissue marker
clip was deployed into the biopsy cavity.

Using sterile technique, 1% Lidocaine, MRI guidance, and a 9 gauge
vacuum assisted device, biopsy was performed of linear enhancement
in the inferior left breast using a lateral approach. At the
conclusion of the procedure, a barbell shaped tissue marker clip was
deployed into the biopsy cavity.

Using sterile technique, 1% Lidocaine, MRI guidance, and a 9 gauge
vacuum assisted device, biopsy was performed of linear enhancement
in the lateral right breast using a lateral approach. At the
conclusion of the procedure, a barbell shaped tissue marker clip was
deployed into the biopsy cavity.

Follow-up 2-view mammogram was performed and dictated separately.
IMPRESSION: MRI guided biopsy of the bilateral breasts. This includes 2 areas on
the left and 1 area on the right. No apparent complications.

ADDENDUM:
Pathology revealed LOBULAR NEOPLASIA (ATYPICAL LOBULAR HYPERPLASIA)
and FIBROCYSTIC CHANGES of the LEFT breast, subareolar. This was
found to be concordant by Dr. Benrabah Etoil, with excision
recommended.

Pathology revealed LOBULAR NEOPLASIA (ATYPICAL LOBULAR HYPERPLASIA)
and FIBROCYSTIC CHANGES of the LEFT breast, 6 o'clock anterior.
This was found to be concordant by Dr. Benrabah Etoil, with excision
recommended.

Pathology revealed LOBULAR NEOPLASIA (ATYPICAL LOBULAR HYPERPLASIA)
and FIBROCYSTIC CHANGES of the RIGHT breast, lateral anterior. This
was found to be concordant by Dr. Benrabah Etoil, with excision
recommended.

Imaging and reports sent to [facility] at [REDACTED]
[REDACTED]. Per Provider request, biopsy
results will be called by Provider and plan of follow up
implemented.

Pathology results reported by Farag Ali Yaga, RN on 06/22/2018.

*** End of Addendum ***
FINDINGS: I met with the patient, and we discussed the procedure of MRI guided
biopsy, including risks, benefits, and alternatives. Specifically,
we discussed the risks of infection, bleeding, tissue injury, clip
migration, and inadequate sampling. Informed, written consent was
given. The usual time out protocol was performed immediately prior
to the procedure.

Using sterile technique, 1% Lidocaine, MRI guidance, and a 9 gauge
vacuum assisted device, biopsy was performed of of linear
enhancement in the subareolar left breast using a lateral approach.
At the conclusion of the procedure, a cylinder shaped tissue marker
clip was deployed into the biopsy cavity.

Using sterile technique, 1% Lidocaine, MRI guidance, and a 9 gauge
vacuum assisted device, biopsy was performed of linear enhancement
in the inferior left breast using a lateral approach. At the
conclusion of the procedure, a barbell shaped tissue marker clip was
deployed into the biopsy cavity.

Using sterile technique, 1% Lidocaine, MRI guidance, and a 9 gauge
vacuum assisted device, biopsy was performed of linear enhancement
in the lateral right breast using a lateral approach. At the
conclusion of the procedure, a barbell shaped tissue marker clip was
deployed into the biopsy cavity.

Follow-up 2-view mammogram was performed and dictated separately.
IMPRESSION: MRI guided biopsy of the bilateral breasts. This includes 2 areas on
the left and 1 area on the right. No apparent complications.
